# Patient Record
Sex: Female | Born: 1960 | Race: White | Hispanic: No | Marital: Single | Smoking: Never smoker
Health system: Southern US, Community
[De-identification: ages and names within clinical notes are randomized; demographics above are authoritative.]

## PROBLEM LIST (undated history)

## (undated) DIAGNOSIS — H547 Unspecified visual loss: Secondary | ICD-10-CM

---

## 2018-07-27 ENCOUNTER — Emergency Department (HOSPITAL_COMMUNITY)
Admission: EM | Admit: 2018-07-27 | Discharge: 2018-07-27 | Disposition: A | Discharge status: Home / Self Care | Payer: BLUE CROSS/BLUE SHIELD | Attending: Emergency Medicine | Admitting: Emergency Medicine

## 2018-07-27 ENCOUNTER — Encounter (HOSPITAL_COMMUNITY): Payer: Self-pay | Admitting: Emergency Medicine

## 2018-07-27 DIAGNOSIS — M5432 Sciatica, left side: Secondary | ICD-10-CM

## 2018-07-27 DIAGNOSIS — M79606 Pain in leg, unspecified: Secondary | ICD-10-CM | POA: Diagnosis not present

## 2018-07-27 DIAGNOSIS — M545 Low back pain: Secondary | ICD-10-CM | POA: Diagnosis present

## 2018-07-27 HISTORY — DX: Unspecified visual loss: H54.7

## 2018-07-27 MED ORDER — MELOXICAM 7.5 MG PO TABS: 7.5000 mg | ORAL_TABLET | Freq: Every day | ORAL | 0 refills | Status: AC

## 2018-07-27 MED ORDER — METHOCARBAMOL 500 MG PO TABS: 500.0000 mg | ORAL_TABLET | Freq: Four times a day (QID) | ORAL | 0 refills | Status: AC

## 2018-07-27 NOTE — ED Provider Notes (Signed)
[facility]-EMERGENCY DEPT Provider Note   CSN: [id number] Arrival date & time: 07/27/18  [id number]     History   Chief Complaint Chief Complaint  Patient presents with  . Back Pain  . Leg Pain    HPI Natalie Mcbride is a 57 y.o. female.  Patient presents with 3-day history of left lower back pain with radiation into her buttock and upper leg.  Patient describes the pain as a throbbing, toothache type of pain.  She denies any weakness.  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  Pain is worse when she is sitting and standing up, better when she is lying down.  She has been taking Advil which helps temporarily.  She is also been applying heat.  She has not had symptoms like this in the past.  She reports working regularly and exercising in a pool in an effort to lose weight. Patient denies warning symptoms of back pain including: fecal incontinence, urinary retention or overflow incontinence, night sweats, waking from sleep with back pain, unexplained fevers or weight loss, h/o cancer, IVDU, recent trauma.        Past Medical History:  Diagnosis Date  . Blind     There are no active problems to display for this patient.   History reviewed. No pertinent surgical history.   OB History   None      Home Medications    Prior to Admission medications   Medication Sig Start Date End Date Taking? Authorizing Provider  meloxicam (MOBIC) 7.5 MG tablet Take 1 tablet (7.5 mg total) by mouth daily. 07/27/18   Renne CriglerGeiple, Tiegan Jambor, PA-C  methocarbamol (ROBAXIN) 500 MG tablet Take 1 tablet (500 mg total) by mouth 4 (four) times daily. 07/27/18   Renne CriglerGeiple, Swan Zayed, PA-C    Family History No family history on file.  Social History Social History   Tobacco Use  . Smoking status: Never Smoker  . Smokeless tobacco: Never Used  Substance Use Topics  . Alcohol use: Never    Frequency: Never   . Drug use: Never     Allergies   Patient has no known allergies.   Review of Systems Review of Systems  Constitutional: Negative for fever and unexpected weight change.  Gastrointestinal: Negative for constipation.       Negative for fecal incontinence.   Genitourinary: Negative for dysuria, flank pain, hematuria, pelvic pain, vaginal bleeding and vaginal discharge.       Negative for urinary incontinence or retention.  Musculoskeletal: Positive for back pain and myalgias. Negative for arthralgias.  Neurological: Negative for weakness and numbness.       Denies saddle paresthesias.     Physical Exam Updated Vital Signs BP (!) 184/104 (BP Location: Left Arm)   Pulse 67   Temp 99.4 F (37.4 C) (Oral)   Resp 16   SpO2 95%   Physical Exam  Constitutional: She appears well-developed and well-nourished.  HENT:  Head: Normocephalic and atraumatic.  Eyes: Conjunctivae are normal.  Neck: Normal range of motion. Neck supple.  Pulmonary/Chest: Effort normal.  Abdominal: Soft. There is no tenderness. There is no CVA tenderness.  Musculoskeletal: Normal range of motion.       Lumbar back: She exhibits tenderness. She exhibits normal range of motion and no bony tenderness.       Back:  No step-off noted with palpation of spine.   Neurological: She is  alert. She has normal strength and normal reflexes. No sensory deficit.  5/5 strength in entire lower extremities bilaterally. No sensation deficit.   Skin: Skin is warm and dry. No rash noted.  Psychiatric: She has a normal mood and affect.  Nursing note and vitals reviewed.    ED Treatments / Results  Labs (all labs ordered are listed, but only abnormal results are displayed) Labs Reviewed - No data to display  EKG None  Radiology No results found.  Procedures Procedures (including critical care time)  Medications Ordered in ED Medications - No data to display   Initial Impression / Assessment and Plan / ED  Course  I have reviewed the triage vital signs and the nursing notes.  Pertinent labs & imaging results that were available during my care of the patient were reviewed by me and considered in my medical decision making (see chart for details).     8:00 AM Patient seen and examined. Work-up initiated. Medications ordered.   Vital signs reviewed and are as follows: Vitals:   07/27/18 0704  BP: (!) 184/104  Pulse: 67  Resp: 16  Temp: 99.4 F (37.4 C)  SpO2: 95%   No red flag s/s of low back pain. Patient was counseled on back pain precautions and told to do activity as tolerated but do not lift, push, or pull heavy objects more than 10 pounds for the next week.  Patient counseled to use ice or heat on back for no longer than 15 minutes every hour.   Patient counseled on proper use of muscle relaxant medication.  They were told not to drink alcohol, drive any vehicle, or do any dangerous activities while taking this medication.  Patient verbalized understanding.  Patient urged to follow-up with PCP if pain does not improve with treatment and rest or if pain becomes recurrent. Urged to return with worsening severe pain, loss of bowel or bladder control, trouble walking.   The patient verbalizes understanding and agrees with the plan.    Final Clinical Impressions(s) / ED Diagnoses   Final diagnoses:  Sciatica of left side   Patient with back pain with radicular features. No neurological deficits. Patient is ambulatory. No warning symptoms of back pain including: fecal incontinence, urinary retention or overflow incontinence, night sweats, waking from sleep with back pain, unexplained fevers or weight loss, h/o cancer, IVDU, recent trauma. No concern for cauda equina, epidural abscess, or other serious cause of back pain. Conservative measures such as rest, ice/heat and pain medicine indicated with PCP follow-up if no improvement with conservative management.    ED Discharge Orders          Ordered    meloxicam (MOBIC) 7.5 MG tablet  Daily     07/27/18 0754    methocarbamol (ROBAXIN) 500 MG tablet  4 times daily     07/27/18 0754           Renne CriglerGeiple, Jeyren Danowski, PA-C 07/27/18 0802    Pricilla LovelessGoldston, Scott, MD 07/27/18 [phone]

## 2018-07-27 NOTE — Discharge Instructions (Signed)
Please read and follow all provided instructions.  Your diagnoses today include:  1. Sciatica of left side     Tests performed today include:  Vital signs - see below for your results today  Medications prescribed:   Meloxicam - anti-inflammatory pain medication  You have been prescribed an anti-inflammatory medication or NSAID. Take with food. Do not take aspirin, ibuprofen, or naproxen if taking this medication. Take smallest effective dose for the shortest duration needed for your pain. Stop taking if you experience stomach pain or vomiting.    Robaxin (methocarbamol) - muscle relaxer medication  DO NOT drive or perform any activities that require you to be awake and alert because this medicine can make you drowsy.   Take any prescribed medications only as directed.  Home care instructions:   Follow any educational materials contained in this packet  Please rest, use ice or heat on your back for the next several days  Do not lift, push, pull anything more than 10 pounds for the next week  Follow-up instructions: Please follow-up with your primary care provider in the next 1 week for further evaluation of your symptoms.   Return instructions:  SEEK IMMEDIATE MEDICAL ATTENTION IF YOU HAVE:  New numbness, tingling, weakness, or problem with the use of your arms or legs  Severe back pain not relieved with medications  Loss control of your bowels or bladder  Increasing pain in any areas of the body (such as chest or abdominal pain)  Shortness of breath, dizziness, or fainting.   Worsening nausea (feeling sick to your stomach), vomiting, fever, or sweats  Any other emergent concerns regarding your health   Additional Information:  Your vital signs today were: BP (!) 184/104 (BP Location: Left Arm)    Pulse 67    Temp 99.4 F (37.4 C) (Oral)    Resp 16    SpO2 95%  If your blood pressure (BP) was elevated above 135/85 this visit, please have this repeated by your  doctor within one month. --------------

## 2018-07-27 NOTE — ED Triage Notes (Signed)
Patient here from home with complaints of lower back pain and leg pain, 7/10. Advil with no relief.

## 2018-12-13 ENCOUNTER — Other Ambulatory Visit: Payer: Self-pay | Admitting: Physician Assistant

## 2018-12-13 DIAGNOSIS — Z1231 Encounter for screening mammogram for malignant neoplasm of breast: Secondary | ICD-10-CM

## 2019-01-16 ENCOUNTER — Ambulatory Visit
Admission: RE | Admit: 2019-01-16 | Discharge: 2019-01-16 | Disposition: A | Discharge status: Home / Self Care | Payer: BLUE CROSS/BLUE SHIELD | Source: Ambulatory Visit | Attending: Physician Assistant | Admitting: Physician Assistant

## 2019-01-16 DIAGNOSIS — Z1231 Encounter for screening mammogram for malignant neoplasm of breast: Secondary | ICD-10-CM

## 2019-06-05 IMAGING — MG DIGITAL SCREENING BILATERAL MAMMOGRAM WITH CAD
4 series · 4 of 4 positions shown · non-contrast
Comparison: Previous exam(s).

CLINICAL DATA: Screening.

EXAM:
DIGITAL SCREENING BILATERAL MAMMOGRAM WITH CAD

[R CC]
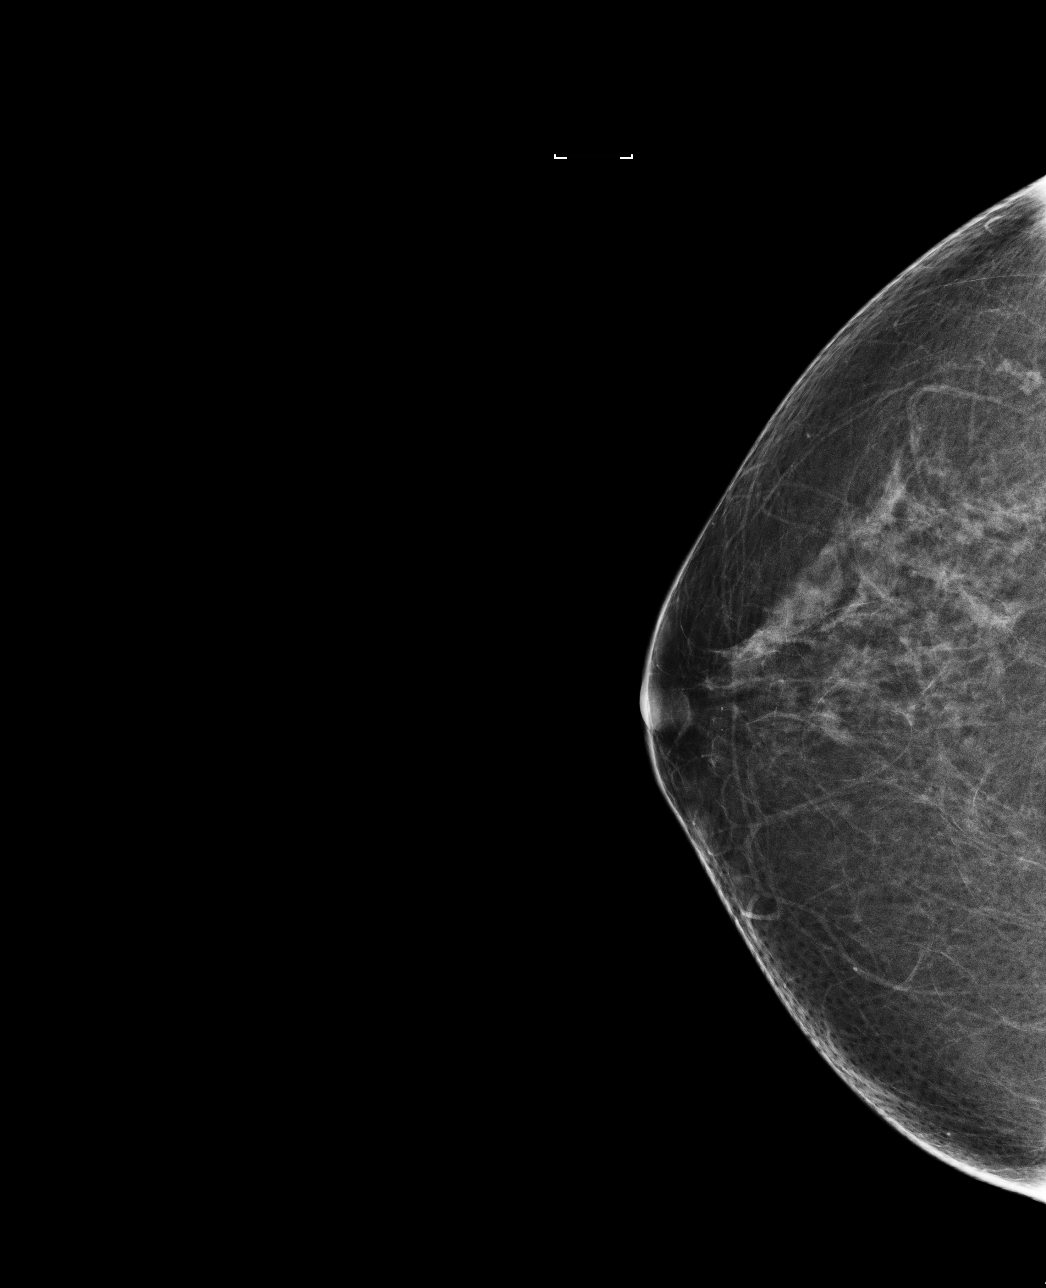

[L MLO]
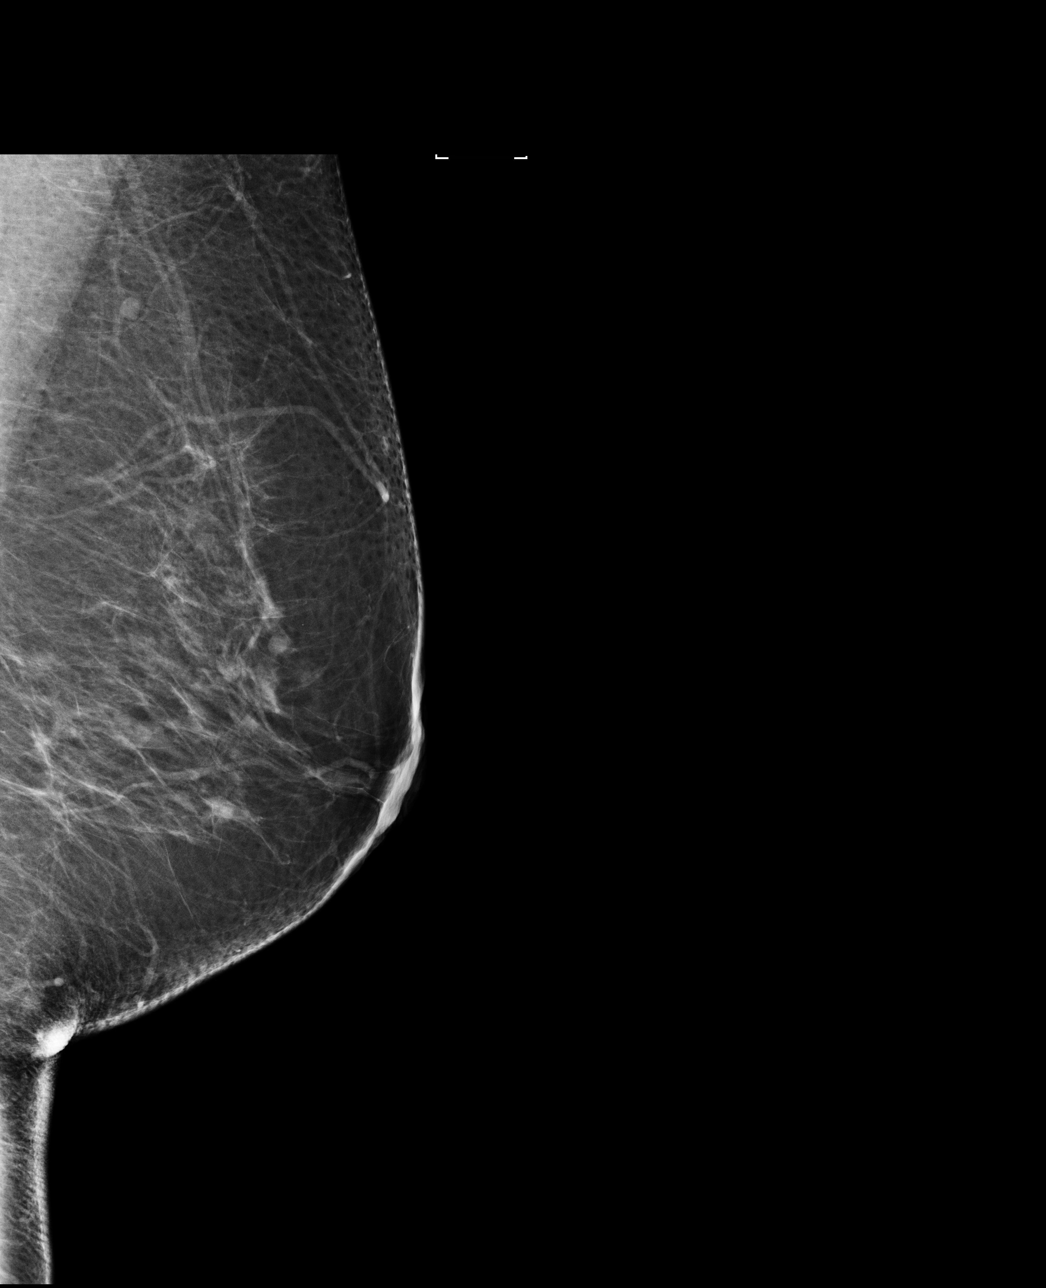

[R MLO]
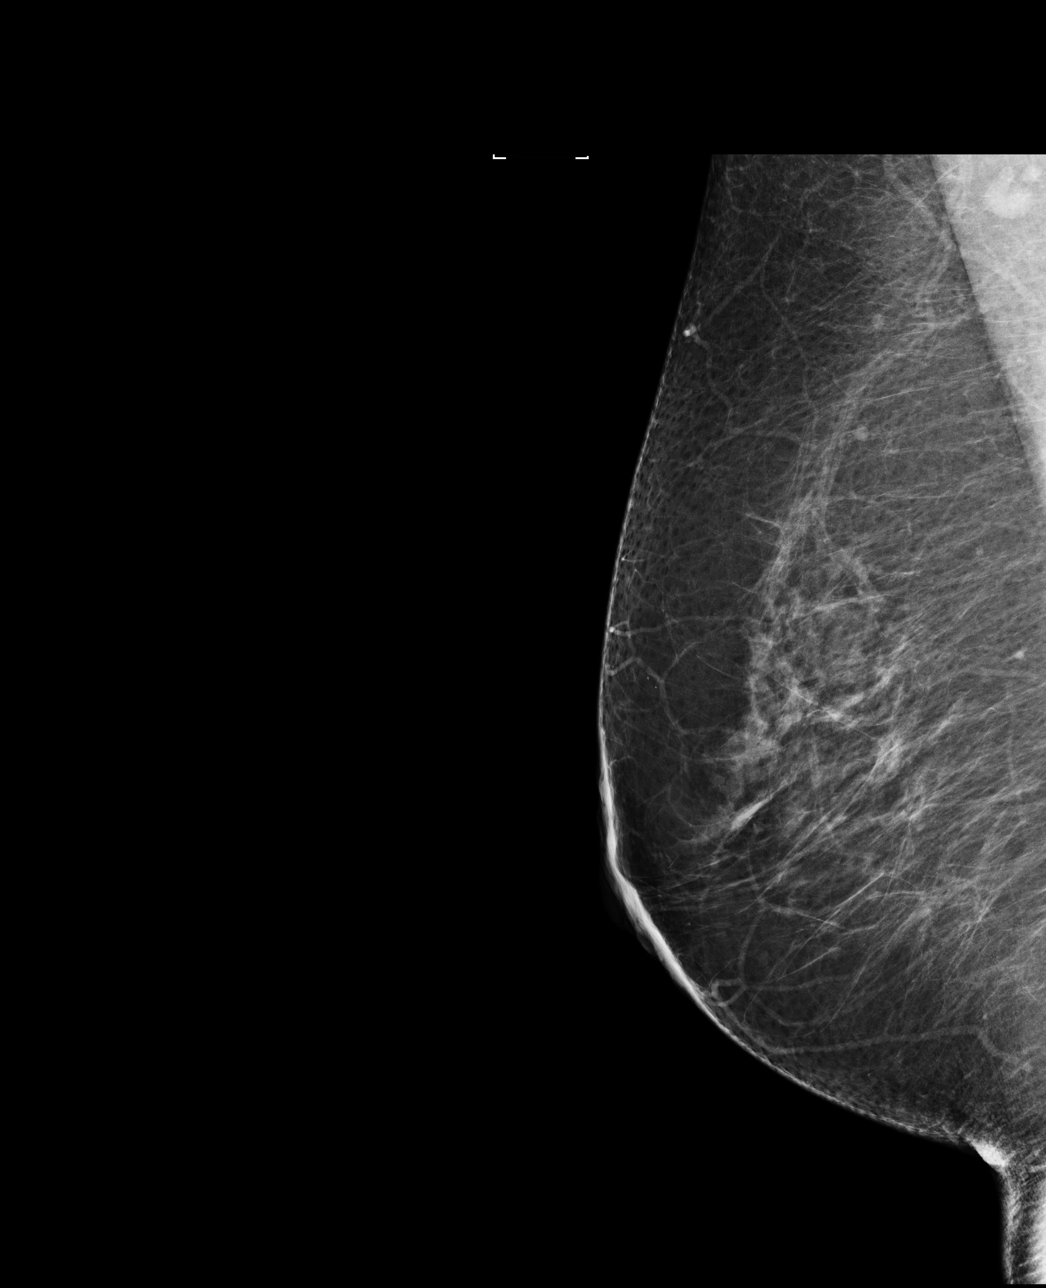

[L CC]
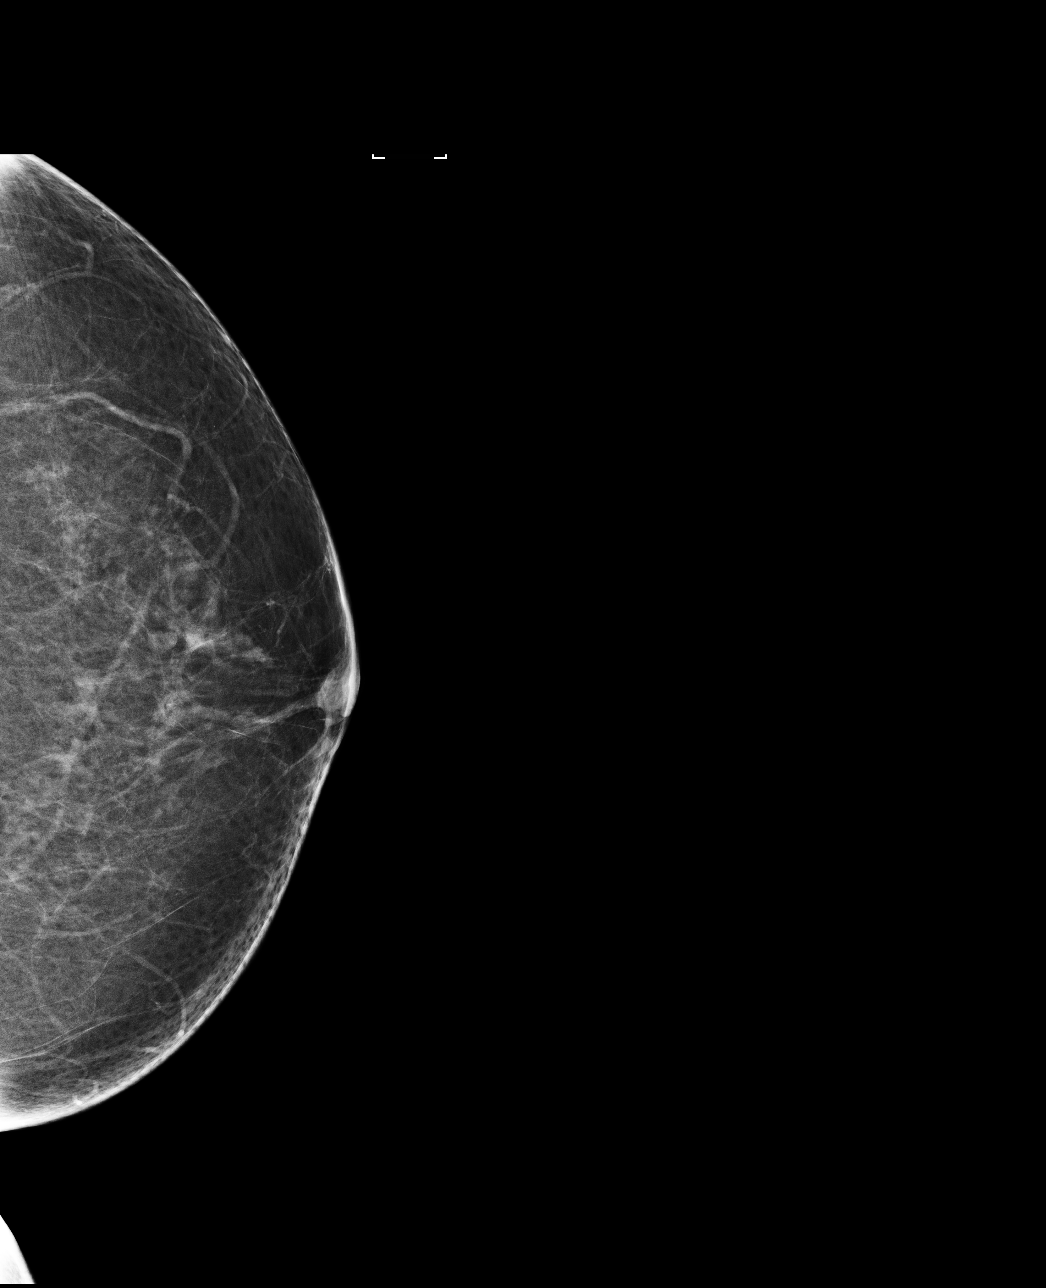

[4 of 4 positions shown; findings below may reference images not displayed]

ACR Breast Density Category b: There are scattered areas of
fibroglandular density.
FINDINGS: There are no findings suspicious for malignancy. Images were
processed with CAD.
IMPRESSION: No mammographic evidence of malignancy. A result letter of this
screening mammogram will be mailed directly to the patient.

RECOMMENDATION:
Screening mammogram in one year. (Code:AS-G-LCT)

BI-RADS CATEGORY  1: Negative.

## 2020-02-22 ENCOUNTER — Other Ambulatory Visit: Payer: Self-pay | Admitting: Physician Assistant

## 2020-02-22 DIAGNOSIS — Z1231 Encounter for screening mammogram for malignant neoplasm of breast: Secondary | ICD-10-CM

## 2020-02-24 ENCOUNTER — Ambulatory Visit: Payer: Self-pay | Attending: Internal Medicine

## 2020-02-24 ENCOUNTER — Ambulatory Visit: Payer: BLUE CROSS/BLUE SHIELD

## 2020-02-24 DIAGNOSIS — Z23 Encounter for immunization: Secondary | ICD-10-CM

## 2020-02-24 NOTE — Progress Notes (Signed)
   Covid-19 Vaccination Clinic  Name:  Natalie Mcbride    MRN: [id number] DOB: [birth year]  02/24/2020  [name] was observed post Covid-19 immunization for 15 minutes without incident. She was provided with Vaccine Information Sheet and instruction to access the V-Safe system.   [name] was instructed to call 911 with any severe reactions post vaccine: Marland Kitchen Difficulty breathing  . Swelling of face and throat  . A fast heartbeat  . A bad rash all over body  . Dizziness and weakness   Immunizations Administered    Name Date Dose VIS Date Route   Pfizer COVID-19 Vaccine 02/24/2020  9:26 AM 0.3 mL 11/24/2019 Intramuscular   Manufacturer: [employer], [employer]   Lot: [id number]   NDC: [address]-4

## 2020-03-18 ENCOUNTER — Ambulatory Visit: Payer: Self-pay

## 2020-03-18 ENCOUNTER — Ambulatory Visit: Payer: Self-pay | Attending: Internal Medicine

## 2020-03-18 DIAGNOSIS — Z23 Encounter for immunization: Secondary | ICD-10-CM

## 2020-03-18 NOTE — Progress Notes (Signed)
   Covid-19 Vaccination Clinic  Name:  Alonia Dibuono    MRN: [id number] DOB: 12-21-1960  03/18/2020  Ms. Fedorko was observed post Covid-19 immunization for 15 minutes without incident. She was provided with Vaccine Information Sheet and instruction to access the V-Safe system.   Ms. Bultman was instructed to call 911 with any severe reactions post vaccine: Marland Kitchen Difficulty breathing  . Swelling of face and throat  . A fast heartbeat  . A bad rash all over body  . Dizziness and weakness   Immunizations Administered    Name Date Dose VIS Date Route   Pfizer COVID-19 Vaccine 03/18/2020 11:13 AM 0.3 mL 11/24/2019 Intramuscular   Manufacturer: [employer], [employer]   Lot: [id number]   NDC: [address]-5

## 2020-04-29 ENCOUNTER — Other Ambulatory Visit: Payer: Self-pay

## 2020-04-29 ENCOUNTER — Ambulatory Visit
Admission: RE | Admit: 2020-04-29 | Discharge: 2020-04-29 | Disposition: A | Discharge status: Home / Self Care | Payer: BC Managed Care – PPO | Source: Ambulatory Visit | Attending: Physician Assistant | Admitting: Physician Assistant

## 2020-04-29 DIAGNOSIS — Z1231 Encounter for screening mammogram for malignant neoplasm of breast: Secondary | ICD-10-CM

## 2021-06-23 ENCOUNTER — Other Ambulatory Visit: Payer: Self-pay | Admitting: Physician Assistant

## 2021-06-23 ENCOUNTER — Other Ambulatory Visit: Payer: Self-pay | Admitting: Emergency Medicine

## 2021-06-23 DIAGNOSIS — Z1231 Encounter for screening mammogram for malignant neoplasm of breast: Secondary | ICD-10-CM

## 2021-08-14 ENCOUNTER — Ambulatory Visit: Payer: BC Managed Care – PPO

## 2023-06-09 ENCOUNTER — Other Ambulatory Visit: Payer: Self-pay | Admitting: Physician Assistant

## 2023-06-09 DIAGNOSIS — Z1231 Encounter for screening mammogram for malignant neoplasm of breast: Secondary | ICD-10-CM

## 2023-07-01 ENCOUNTER — Ambulatory Visit
Admission: RE | Admit: 2023-07-01 | Discharge: 2023-07-01 | Discharge status: Home / Self Care | Payer: 59 | Source: Ambulatory Visit | Attending: Physician Assistant

## 2023-07-01 DIAGNOSIS — Z1231 Encounter for screening mammogram for malignant neoplasm of breast: Secondary | ICD-10-CM

## 2024-06-07 ENCOUNTER — Other Ambulatory Visit: Payer: Self-pay | Admitting: Physician Assistant

## 2024-06-07 DIAGNOSIS — Z1231 Encounter for screening mammogram for malignant neoplasm of breast: Secondary | ICD-10-CM

## 2024-07-03 ENCOUNTER — Ambulatory Visit

## 2024-07-10 ENCOUNTER — Ambulatory Visit
Admission: RE | Admit: 2024-07-10 | Discharge: 2024-07-10 | Disposition: A | Discharge status: Home / Self Care | Source: Ambulatory Visit | Attending: Physician Assistant | Admitting: Physician Assistant

## 2024-07-10 DIAGNOSIS — Z1231 Encounter for screening mammogram for malignant neoplasm of breast: Secondary | ICD-10-CM
